# Patient Record
Sex: Female | Born: 1960 | Race: White | Hispanic: No | Marital: Single | Smoking: Current every day smoker
Health system: Southern US, Community
[De-identification: ages and names within clinical notes are randomized; demographics above are authoritative.]

## PROBLEM LIST (undated history)

## (undated) DIAGNOSIS — F419 Anxiety disorder, unspecified: Secondary | ICD-10-CM

## (undated) DIAGNOSIS — I10 Essential (primary) hypertension: Secondary | ICD-10-CM

## (undated) DIAGNOSIS — R519 Headache, unspecified: Secondary | ICD-10-CM

## (undated) DIAGNOSIS — R51 Headache: Secondary | ICD-10-CM

## (undated) HISTORY — PX: JOINT REPLACEMENT: SHX530

## (undated) HISTORY — PX: CHOLECYSTECTOMY: SHX55

## (undated) HISTORY — PX: FOOT SURGERY: SHX648

## (undated) HISTORY — PX: APPENDECTOMY: SHX54

## (undated) HISTORY — PX: PATELLA FRACTURE SURGERY: SHX735

## (undated) HISTORY — PX: ANKLE SURGERY: SHX546

---

## 1998-02-24 ENCOUNTER — Other Ambulatory Visit: Admission: RE | Admit: 1998-02-24 | Discharge: 1998-02-24 | Payer: Self-pay | Admitting: Family Medicine

## 1999-05-07 ENCOUNTER — Other Ambulatory Visit: Admission: RE | Admit: 1999-05-07 | Discharge: 1999-05-07 | Payer: Self-pay | Admitting: Obstetrics and Gynecology

## 2000-10-11 ENCOUNTER — Other Ambulatory Visit: Admission: RE | Admit: 2000-10-11 | Discharge: 2000-10-11 | Payer: Self-pay | Admitting: Obstetrics and Gynecology

## 2002-02-14 ENCOUNTER — Other Ambulatory Visit: Admission: RE | Admit: 2002-02-14 | Discharge: 2002-02-14 | Payer: Self-pay | Admitting: Obstetrics and Gynecology

## 2002-09-09 ENCOUNTER — Emergency Department (HOSPITAL_COMMUNITY): Admission: EM | Admit: 2002-09-09 | Discharge: 2002-09-09 | Payer: Self-pay | Admitting: *Deleted

## 2002-09-09 ENCOUNTER — Encounter: Payer: Self-pay | Admitting: *Deleted

## 2002-12-18 ENCOUNTER — Encounter: Payer: Self-pay | Admitting: Orthopedic Surgery

## 2002-12-18 ENCOUNTER — Encounter: Admission: RE | Admit: 2002-12-18 | Discharge: 2002-12-18 | Payer: Self-pay | Admitting: Orthopedic Surgery

## 2003-11-04 ENCOUNTER — Encounter: Admission: RE | Admit: 2003-11-04 | Discharge: 2003-11-04 | Payer: Self-pay | Admitting: Orthopedic Surgery

## 2004-07-10 IMAGING — CT CT EXTREM LOW W/O CM*R*
2 of 4 series · 6 of 14 positions shown, 7 images · non-contrast
Comparison: none

FINDINGS
CLINICAL DATA: RIGHT PATELLAR FRACTURE WITH CONTINUED PAIN AND SWELLING.
TECHNIQUE
MULTIDETECTOR HELICAL CT SCANNING OBTAINED THROUGH THE RIGHT KNEE.  SAGITTAL AND CORONAL
RECONSTRUCTED IMAGES WERE PERFORMED.
CT RIGHT LOWER EXTREMITY W/O CONTRAST
COMPARISON TO PLAIN FILMS PERFORMED ON 12/13/02 AND 09/09/02.
TWO SCREW AND WIRE FIXATION THROUGH THE COMMINUTED PATELLAR FRACTURE IS AGAIN NOTED CORRESPONDING
TO RECENT PLAIN FILMS.  FRACTURE LINES ARE STILL PRESENT WITH MINIMAL EVIDENCE OF BONY
BRIDGING/HEALING.   THERE IS MILD POSTERIOR DISPLACEMENT OF THE INFERIOR FRAGMENT OF APPROXIMATELY
5MM.  THERE IS A 3 X 5 X 10MM BONY FRAGMENT AT THE INFERIOR TIP OF THE PATELLA IN THE REGION OF
ORIGIN OF THE PATELLAR LIGAMENT.  EDEMA SURROUNDING THE VISUALIZED QUADRICEPS TENDON AND PATELLAR
LIGAMENT ARE NOTED BUT THOSE STRUCTURES ARE DIFFICULT TO EVALUATE ON CT.  SMALL KNEE EFFUSION IS
PRESENT.  THE DISTAL FEMUR AND PROXIMAL TIBIA AND FIBULA ARE UNREMARKABLE EXCEPT FOR MILD
OSTEOPENIA.
IMPRESSION
1.  COMMINUTED RIGHT PATELLAR FRACTURE WITH FRACTURE LINES STILL PRESENT WITH LITTLE TO NO EVIDENCE
OF INTERVAL BONY BRIDGING/HEALING.  INTERNAL SURGICAL FIXATION AS DESCRIBED.
2.  EDEMA SURROUNDING THE VISUALIZED QUADRICEPS TENDON AND PATELLAR LIGAMENT.  IT IS DIFFICULT TO
EVALUATE THESE STRUCTURES ON CT.
3.  SMALL KNEE EFFUSION.
CT MULTIPLANAR RECONSTRUCTION
MULTIPLANAR REFORMATTED CT IMAGES WERE RECONSTRUCTED FROM THE AXIAL CT DATA SET.  THESE IMAGES WERE
REVIEWED, AND PERTINENT FINDINGS ARE INCLUDED IN THE ACCOMPANYING COMPLETE CT REPORT.
SEE COMPLETE CT REPORT.

[Series 3: check recon 2 · axial · 0.31mm/px · z∈[-156,-96]mm · 3 of 98 slices shown, 4 images]
[im 25/98  soft-tissue]
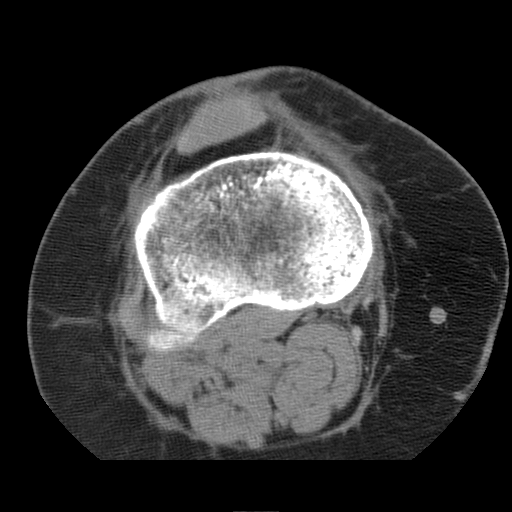
[im 25/98  bone]
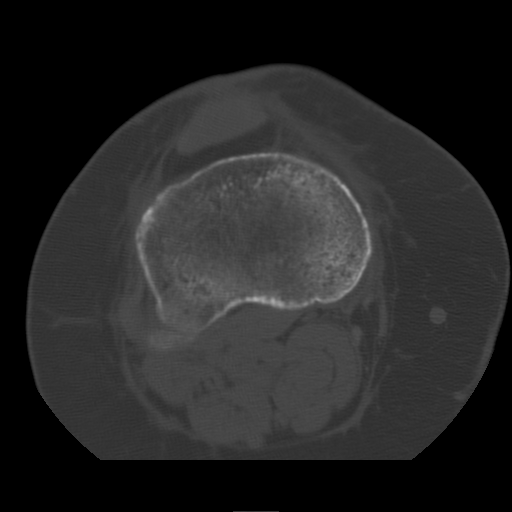
[im 49/98  bone]
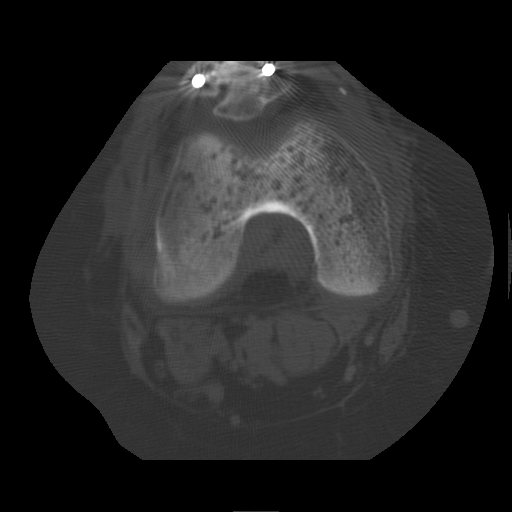
[im 73/98  bone]
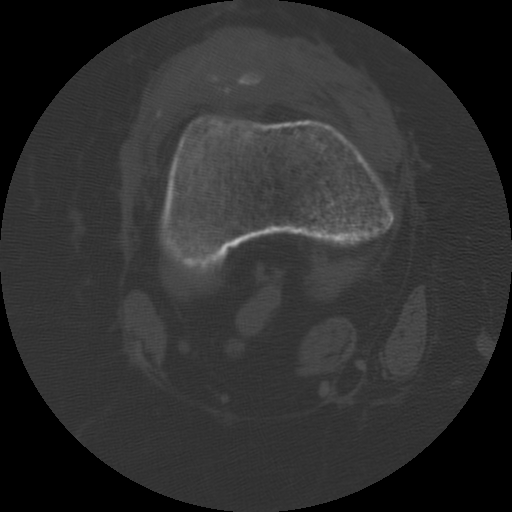

[Series 4: recon 2: check recon 2 · axial · 0.31mm/px · z∈[-155,-95]mm · 3 of 97 slices shown]
[im 25/97  bone]
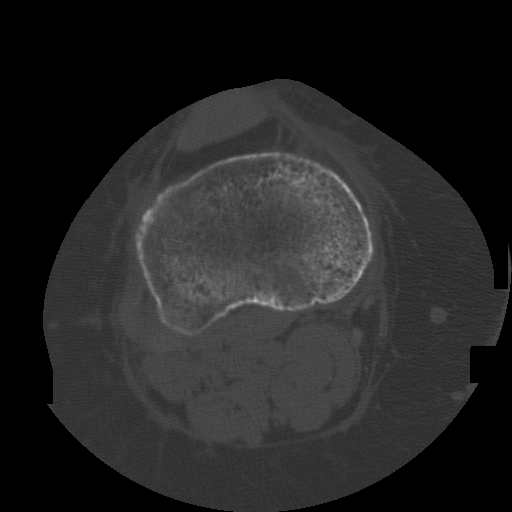
[im 49/97  bone]
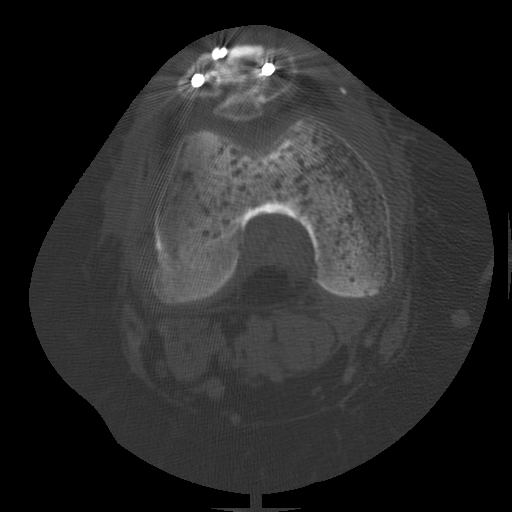
[im 73/97  bone]
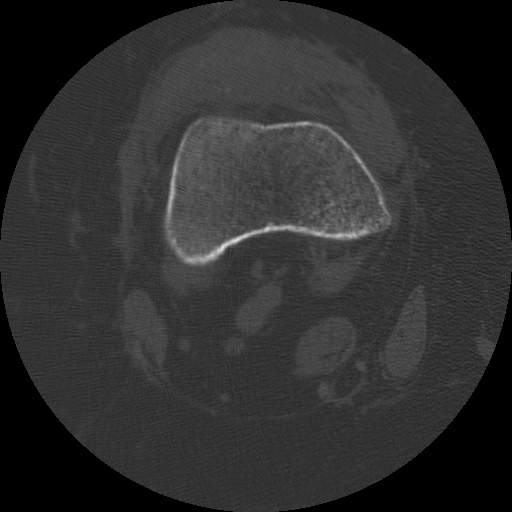

[6 of 14 positions shown; findings below may reference images not displayed]

## 2010-12-10 ENCOUNTER — Other Ambulatory Visit (HOSPITAL_COMMUNITY): Payer: Self-pay

## 2010-12-15 ENCOUNTER — Encounter (HOSPITAL_COMMUNITY)
Admission: RE | Admit: 2010-12-15 | Discharge: 2010-12-15 | Disposition: A | Discharge status: Home / Self Care | Payer: BC Managed Care – PPO | Source: Ambulatory Visit | Attending: Orthopedic Surgery | Admitting: Orthopedic Surgery

## 2010-12-15 LAB — CBC
HCT: 40.5 % (ref 36.0–46.0)
Hemoglobin: 14.2 g/dL (ref 12.0–15.0)
MCH: 31.6 pg (ref 26.0–34.0)
MCHC: 35.1 g/dL (ref 30.0–36.0)
MCV: 90.2 fL (ref 78.0–100.0)
Platelets: 462 10*3/uL — ABNORMAL HIGH (ref 150–400)
RBC: 4.49 MIL/uL (ref 3.87–5.11)
RDW: 11.8 % (ref 11.5–15.5)
WBC: 10.8 10*3/uL — ABNORMAL HIGH (ref 4.0–10.5)

## 2010-12-15 LAB — COMPREHENSIVE METABOLIC PANEL
Albumin: 4.2 g/dL (ref 3.5–5.2)
Alkaline Phosphatase: 93 U/L (ref 39–117)
BUN: 20 mg/dL (ref 6–23)
Calcium: 10.8 mg/dL — ABNORMAL HIGH (ref 8.4–10.5)
Potassium: 4.4 mEq/L (ref 3.5–5.1)
Total Protein: 7.4 g/dL (ref 6.0–8.3)

## 2010-12-15 LAB — URINALYSIS, ROUTINE W REFLEX MICROSCOPIC
Bilirubin Urine: NEGATIVE
Nitrite: NEGATIVE
Specific Gravity, Urine: 1.018 (ref 1.005–1.030)
pH: 5.5 (ref 5.0–8.0)

## 2010-12-15 LAB — PROTIME-INR: INR: 0.99 (ref 0.00–1.49)

## 2010-12-15 LAB — TYPE AND SCREEN: Antibody Screen: NEGATIVE

## 2010-12-15 LAB — URINE MICROSCOPIC-ADD ON

## 2010-12-15 LAB — SURGICAL PCR SCREEN: MRSA, PCR: NEGATIVE

## 2010-12-22 ENCOUNTER — Inpatient Hospital Stay (HOSPITAL_COMMUNITY): Payer: BC Managed Care – PPO

## 2010-12-22 ENCOUNTER — Inpatient Hospital Stay (HOSPITAL_COMMUNITY)
Admission: RE | Admit: 2010-12-22 | Discharge: 2010-12-24 | DRG: 209 | Disposition: A | Discharge status: Home Health | Payer: BC Managed Care – PPO | Source: Ambulatory Visit | Attending: Orthopedic Surgery | Admitting: Orthopedic Surgery

## 2010-12-22 DIAGNOSIS — M1710 Unilateral primary osteoarthritis, unspecified knee: Principal | ICD-10-CM | POA: Diagnosis present

## 2010-12-22 DIAGNOSIS — IMO0002 Reserved for concepts with insufficient information to code with codable children: Principal | ICD-10-CM | POA: Diagnosis present

## 2010-12-22 DIAGNOSIS — M8708 Idiopathic aseptic necrosis of bone, other site: Secondary | ICD-10-CM | POA: Diagnosis present

## 2010-12-23 LAB — PROTIME-INR: Prothrombin Time: 14.3 seconds (ref 11.6–15.2)

## 2010-12-23 LAB — BASIC METABOLIC PANEL
BUN: 13 mg/dL (ref 6–23)
Creatinine, Ser: 0.82 mg/dL (ref 0.4–1.2)
GFR calc Af Amer: >60 mL/min (ref >60)
GFR calc non Af Amer: >60 mL/min (ref >60)
Glucose, Bld: 118 mg/dL — ABNORMAL HIGH (ref 70–99)

## 2010-12-23 LAB — CBC
MCH: 31.4 pg (ref 26.0–34.0)
MCHC: 34 g/dL (ref 30.0–36.0)
Platelets: 354 10*3/uL (ref 150–400)
RDW: 12.2 % (ref 11.5–15.5)

## 2010-12-24 LAB — BASIC METABOLIC PANEL
BUN: 10 mg/dL (ref 6–23)
CO2: 25 mEq/L (ref 19–32)
Calcium: 8.9 mg/dL (ref 8.4–10.5)
Creatinine, Ser: 0.84 mg/dL (ref 0.50–1.10)

## 2010-12-24 LAB — PROTIME-INR
INR: 1.3 (ref 0.00–1.49)
Prothrombin Time: 16.4 seconds — ABNORMAL HIGH (ref 11.6–15.2)

## 2010-12-24 LAB — CBC
HCT: 28.8 % — ABNORMAL LOW (ref 36.0–46.0)
MCH: 31.8 pg (ref 26.0–34.0)
MCHC: 34.7 g/dL (ref 30.0–36.0)
RDW: 12.2 % (ref 11.5–15.5)

## 2010-12-30 NOTE — Op Note (Signed)
[name], [name]                  ACCOUNT NO.:  [id number]  MEDICAL RECORD NO.:  [id number]  LOCATION:  5007                         FACILITY:  [facility]  PHYSICIAN:  Loreta Ave, M.D. 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  PROCEDURE:  Modified minimally invasive right total knee replacement. Soft tissue balancing.  Stryker triathlon prosthesis.  Cemented pegged posterior stabilized #4 femoral component.  Cemented #4 tibial component 9-mm polyethylene insert.  Cemented resurfacing 35-mm patellar component.  Debridement and drilling into areas of residual avascular necrosis prior to implantation of implants.  SURGEON:  Loreta Ave, MD.  ASSISTANT:  Genene Churn. Barry Dienes, PA present throughout the entire case and necessary for timely completion of procedure.  ANESTHESIA:  General.  BLOOD LOSS:  Minimal.  SPECIMEN:  None.  COMPLICATIONS:  None.  DRESSINGS:  Soft compressive and knee immobilizer.  TOURNIQUET TIME:  1 hour 10 minutes.  DRAIN:  Hemovac x1.  PROCEDURE:  The patient was brought to the operating room, placed on the operating table in supine position.  Because of her size, positioning, draping, exposure, all made more difficult.  Knee examined.  Mild flexion contracture, varus alignment not extreme.  Tourniquet applied. Prepped and draped in usual sterile fashion.  Exsanguinated with elevation of Esmarch, tourniquet inflated to 350 mmHg.  Straight incision above the patella down the tibial tubercle.  Medial arthrotomy vastus splitting preserving  quad tendon.  Knee exposed.  Grade 4 changes throughout.  Softening of the subchondral bone, especially medial compartment, tibia, and femur.  Remnants of menisci, cruciate ligaments, periarticular spurs, loose bodies removed.  Distal femur exposed. Intramedullary guide placed.  A 10-mm resection set at 5 degrees of valgus.  Using epicondylar axis, size cut and fitted for posterior stabilized with #4 component.  Some residual areas of bone infarct on the medial side but still very good bone stock and I did not need at the stem.  I did drill into the area of mild residual change on the medial side.  Attention was turned to the tibia.  Extramedullary guide.  A 3- degree posterior slope cut below the defect.  Tibia exposed.  Size #4 component.  Residual avascular changes, medial tibial plateau, treated with multiple drilling.  Again, very reasonable bone stock, so I did not extend them either.  Patella exposed.  Posterior 10 mm removed. Drilled, sized, and fitted for a 35-mm component.  Trial put in place throughout.  A 9-mm insert.  With this construct, I then nicely balanced the knee in flexion/extension.  Good mechanical axis.  Good patellofemoral tracking.  Tibia was marked for rotation and hand reamed. All trials removed.  Copious irrigation with a pulse irrigating device. Cement prepared, placed on all components, firmly seated.  Polyethylene attached to tibia.  Knee reduced.  Patella held with a clamp.  Once the cement was hardened, the knee was reexamined.  Again, pleased with balancing, alignment, stability, tracking.  Hemovac placed through a separate stab wound.  Wound irrigated again.  Arthrotomy closed #1 Vicryl.  Skin and subcutaneous tissue with Vicryl and staples.  Sterile compressive dressing applied.  Tourniquet inflated and removed.  Knee immobilizer applied.  Anesthesia reversed. Brought to recovery room. Tolerated surgery well. No complications.     Loreta Ave,  M.D.     DFM/MEDQ  D:  12/23/2010  T:  12/24/2010  Job:  [id number]  Electronically Signed by Mckinley Jewel M.D. on 12/30/2010 04:11:08 PM

## 2011-06-24 ENCOUNTER — Other Ambulatory Visit: Payer: Self-pay | Admitting: Cardiology

## 2011-06-24 ENCOUNTER — Encounter (INDEPENDENT_AMBULATORY_CARE_PROVIDER_SITE_OTHER): Payer: BC Managed Care – PPO | Admitting: *Deleted

## 2011-06-24 DIAGNOSIS — M7989 Other specified soft tissue disorders: Secondary | ICD-10-CM

## 2011-06-24 DIAGNOSIS — R229 Localized swelling, mass and lump, unspecified: Secondary | ICD-10-CM

## 2015-09-18 ENCOUNTER — Other Ambulatory Visit: Payer: Self-pay | Admitting: Physician Assistant

## 2015-09-18 NOTE — H&P (Signed)
Kelsey [name] comes in for follow up.  Reviewed MRI, left ankle.  Again, all of her symptoms are in the ankle.  Previous injection was very helpful with Marcaine in place, but did not last beyond that.  MRI showed moderate degenerative changes ankle, subtalar joint and midfoot.  Large bone infarct in the distal tibia, smaller infarcts in the talus and navicular.  Tendons and ligaments intact.  There is some chronic tearing of the anterior talofibular ligament, but she has no instability.  Tibiotalar effusion, lesser extent subtalar.  Despite the bone infarcts and the changes in other joints, it is the ankle degenerative change that is the big problem for her.  Her x-rays I have that I have reviewed from the past still look reasonable.  There is some spurring and anterior bony impingement in her ankle, but the midfoot and subtalar joint do not look bad.   Of note, we went through this same process on her right ankle where there are more profound degenerative changes.  That had been treated with an intraarticular injection and then subsequently she went on a course of steroids for bronchitis.  That was over the course of almost a month.  Once she completed that course of oral steroids her right ankle symptoms became tolerable and although still present not anything enough to warrant any treatment at this time.   Remaining history and general exam is reviewed.   EXAMINATION: Lungs clear to auscultation bilaterally.  Heart sounds normal.  Specifically, left ankle she has limited dorsiflexion of less than 10 degrees.  Pain with the extreme of dorsiflexion.  She does not feel unstable.  Inversion, eversion and palpation of the subtalar joint really don't cause any pain.  No midfoot tenderness.  Neurovascularly intact distally.    DISPOSITION:  Symptoms in her left ankle are persisting.  The next step would be arthroscopic debridement and anterior decompression.  Based on the history of her other ankle, I have agreed to try  a course of oral Prednisone over the next two weeks with a 10 mg Sterapred dose pack.  I have cautioned her about side effects.  She is not diabetic.  Once she goes through that we will made a decision about her ankle.  I have discussed exam under anesthesia, arthroscopy, extensive debridement and bony decompression.  Paperwork to proceed with that is all complete, but again I am holding off making a final decision until we see what happens.  Norco she is taking is not helping enough and I did switch her to Percocet that I have told her to use very cautiously while we wait to see what the oral steroids do.  Long-term narcotic management of her ankle is not anything we are considering.  She understands.  I will wait to hear from her.    Loreta Aveaniel F. Murphy, M.D.

## 2015-09-21 ENCOUNTER — Encounter (HOSPITAL_BASED_OUTPATIENT_CLINIC_OR_DEPARTMENT_OTHER): Payer: Self-pay | Admitting: *Deleted

## 2015-09-22 ENCOUNTER — Encounter (HOSPITAL_BASED_OUTPATIENT_CLINIC_OR_DEPARTMENT_OTHER)
Admission: RE | Admit: 2015-09-22 | Discharge: 2015-09-22 | Disposition: A | Discharge status: Home / Self Care | Payer: BLUE CROSS/BLUE SHIELD | Source: Ambulatory Visit | Attending: Orthopedic Surgery | Admitting: Orthopedic Surgery

## 2015-09-22 DIAGNOSIS — F17200 Nicotine dependence, unspecified, uncomplicated: Secondary | ICD-10-CM | POA: Diagnosis not present

## 2015-09-22 DIAGNOSIS — M94272 Chondromalacia, left ankle and joints of left foot: Secondary | ICD-10-CM | POA: Diagnosis not present

## 2015-09-22 DIAGNOSIS — M19072 Primary osteoarthritis, left ankle and foot: Secondary | ICD-10-CM | POA: Diagnosis not present

## 2015-09-22 DIAGNOSIS — I10 Essential (primary) hypertension: Secondary | ICD-10-CM | POA: Diagnosis not present

## 2015-09-22 DIAGNOSIS — Z6841 Body Mass Index (BMI) 40.0 and over, adult: Secondary | ICD-10-CM | POA: Diagnosis not present

## 2015-09-22 DIAGNOSIS — Z791 Long term (current) use of non-steroidal anti-inflammatories (NSAID): Secondary | ICD-10-CM | POA: Diagnosis not present

## 2015-09-22 DIAGNOSIS — M24072 Loose body in left ankle: Secondary | ICD-10-CM | POA: Diagnosis not present

## 2015-09-22 DIAGNOSIS — M24172 Other articular cartilage disorders, left ankle: Secondary | ICD-10-CM | POA: Diagnosis not present

## 2015-09-22 DIAGNOSIS — Z79899 Other long term (current) drug therapy: Secondary | ICD-10-CM | POA: Diagnosis not present

## 2015-09-22 LAB — BASIC METABOLIC PANEL
ANION GAP: 14 (ref 5–15)
BUN: 21 mg/dL — AB (ref 6–20)
CALCIUM: 9.4 mg/dL (ref 8.9–10.3)
CO2: 23 mmol/L (ref 22–32)
CREATININE: 0.98 mg/dL (ref 0.44–1.00)
Chloride: 102 mmol/L (ref 101–111)
GFR calc Af Amer: >60 mL/min (ref >60)
GLUCOSE: 126 mg/dL — AB (ref 65–99)
POTASSIUM: 3.9 mmol/L (ref 3.5–5.1)
Sodium: 139 mmol/L (ref 135–145)

## 2015-09-23 NOTE — Anesthesia Preprocedure Evaluation (Addendum)
Anesthesia Evaluation  Patient identified by MRN, date of birth, ID band Patient awake    Reviewed: Allergy & Precautions, H&P , Patient's Chart, lab work & pertinent test results, reviewed documented beta blocker date and time   Airway Mallampati: II  TM Distance: >3 FB Neck ROM: full    Dental no notable dental hx.    Pulmonary Current Smoker,    Pulmonary exam normal breath sounds clear to auscultation       Cardiovascular hypertension, On Medications  Rhythm:regular Rate:Normal     Neuro/Psych    GI/Hepatic   Endo/Other  Morbid obesity  Renal/GU      Musculoskeletal   Abdominal   Peds  Hematology   Anesthesia Other Findings   Reproductive/Obstetrics                            Anesthesia Physical Anesthesia Plan  ASA: III  Anesthesia Plan:    Post-op Pain Management:    Induction: Intravenous  Airway Management Planned: LMA  Additional Equipment:   Intra-op Plan:   Post-operative Plan:   Informed Consent: I have reviewed the patients History and Physical, chart, labs and discussed the procedure including the risks, benefits and alternatives for the proposed anesthesia with the patient or authorized representative who has indicated his/her understanding and acceptance.   Dental Advisory Given and Dental advisory given  Plan Discussed with: CRNA and Surgeon  Anesthesia Plan Comments: (Discussed GA with LMA, possible sore throat, potential need to switch to ETT, N/V, pulmonary aspiration. Questions answered. )        Anesthesia Quick Evaluation

## 2015-09-24 ENCOUNTER — Ambulatory Visit (HOSPITAL_BASED_OUTPATIENT_CLINIC_OR_DEPARTMENT_OTHER): Payer: BLUE CROSS/BLUE SHIELD | Admitting: Anesthesiology

## 2015-09-24 ENCOUNTER — Encounter (HOSPITAL_BASED_OUTPATIENT_CLINIC_OR_DEPARTMENT_OTHER)
Admission: RE | Disposition: A | Discharge status: Home / Self Care | Payer: Self-pay | Source: Ambulatory Visit | Attending: Orthopedic Surgery

## 2015-09-24 ENCOUNTER — Encounter (HOSPITAL_BASED_OUTPATIENT_CLINIC_OR_DEPARTMENT_OTHER): Payer: Self-pay | Admitting: *Deleted

## 2015-09-24 ENCOUNTER — Ambulatory Visit (HOSPITAL_BASED_OUTPATIENT_CLINIC_OR_DEPARTMENT_OTHER)
Admission: RE | Admit: 2015-09-24 | Discharge: 2015-09-24 | Disposition: A | Discharge status: Home / Self Care | Payer: BLUE CROSS/BLUE SHIELD | Source: Ambulatory Visit | Attending: Orthopedic Surgery | Admitting: Orthopedic Surgery

## 2015-09-24 DIAGNOSIS — Z79899 Other long term (current) drug therapy: Secondary | ICD-10-CM | POA: Insufficient documentation

## 2015-09-24 DIAGNOSIS — Z791 Long term (current) use of non-steroidal anti-inflammatories (NSAID): Secondary | ICD-10-CM | POA: Insufficient documentation

## 2015-09-24 DIAGNOSIS — M94272 Chondromalacia, left ankle and joints of left foot: Secondary | ICD-10-CM | POA: Insufficient documentation

## 2015-09-24 DIAGNOSIS — I10 Essential (primary) hypertension: Secondary | ICD-10-CM | POA: Insufficient documentation

## 2015-09-24 DIAGNOSIS — M19072 Primary osteoarthritis, left ankle and foot: Secondary | ICD-10-CM | POA: Diagnosis not present

## 2015-09-24 DIAGNOSIS — F17200 Nicotine dependence, unspecified, uncomplicated: Secondary | ICD-10-CM | POA: Insufficient documentation

## 2015-09-24 DIAGNOSIS — M24172 Other articular cartilage disorders, left ankle: Secondary | ICD-10-CM | POA: Insufficient documentation

## 2015-09-24 DIAGNOSIS — Z6841 Body Mass Index (BMI) 40.0 and over, adult: Secondary | ICD-10-CM | POA: Insufficient documentation

## 2015-09-24 DIAGNOSIS — M24072 Loose body in left ankle: Secondary | ICD-10-CM | POA: Insufficient documentation

## 2015-09-24 HISTORY — DX: Headache, unspecified: R51.9

## 2015-09-24 HISTORY — DX: Anxiety disorder, unspecified: F41.9

## 2015-09-24 HISTORY — PX: CHONDROPLASTY: SHX5177

## 2015-09-24 HISTORY — DX: Headache: R51

## 2015-09-24 HISTORY — PX: ANKLE ARTHROSCOPY: SHX545

## 2015-09-24 HISTORY — DX: Essential (primary) hypertension: I10

## 2015-09-24 SURGERY — ARTHROSCOPY, ANKLE
Anesthesia: General | Site: Ankle | Laterality: Left

## 2015-09-24 MED ORDER — GLYCOPYRROLATE 0.2 MG/ML IJ SOLN: 0.2000 mg | Freq: Once | INTRAMUSCULAR | Status: DC | PRN

## 2015-09-24 MED ORDER — FENTANYL CITRATE (PF) 100 MCG/2ML IJ SOLN
INTRAMUSCULAR | Status: AC
  Filled 2015-09-24: qty 2

## 2015-09-24 MED ORDER — PROPOFOL 500 MG/50ML IV EMUL
INTRAVENOUS | Status: AC
  Filled 2015-09-24: qty 50

## 2015-09-24 MED ORDER — CLINDAMYCIN PHOSPHATE 900 MG/50ML IV SOLN
900.0000 mg | INTRAVENOUS | Status: AC
  Administered 2015-09-24: 900 mg via INTRAVENOUS

## 2015-09-24 MED ORDER — SCOPOLAMINE 1 MG/3DAYS TD PT72: 1.0000 | MEDICATED_PATCH | Freq: Once | TRANSDERMAL | Status: DC | PRN

## 2015-09-24 MED ORDER — PHENYLEPHRINE HCL 10 MG/ML IJ SOLN
INTRAMUSCULAR | Status: DC | PRN
  Administered 2015-09-24: 40 ug via INTRAVENOUS
  Administered 2015-09-24 (×2): 80 ug via INTRAVENOUS
  Administered 2015-09-24: 40 ug via INTRAVENOUS
  Administered 2015-09-24: 80 ug via INTRAVENOUS

## 2015-09-24 MED ORDER — MIDAZOLAM HCL 2 MG/2ML IJ SOLN
INTRAMUSCULAR | Status: AC
  Filled 2015-09-24: qty 2

## 2015-09-24 MED ORDER — LIDOCAINE HCL (CARDIAC) 20 MG/ML IV SOLN
INTRAVENOUS | Status: AC
  Filled 2015-09-24: qty 5

## 2015-09-24 MED ORDER — ONDANSETRON HCL 4 MG/2ML IJ SOLN
INTRAMUSCULAR | Status: DC | PRN
  Administered 2015-09-24: 4 mg via INTRAVENOUS

## 2015-09-24 MED ORDER — LACTATED RINGERS IV SOLN
INTRAVENOUS | Status: DC
  Administered 2015-09-24 (×2): via INTRAVENOUS

## 2015-09-24 MED ORDER — BUPIVACAINE HCL (PF) 0.5 % IJ SOLN
INTRAMUSCULAR | Status: DC | PRN
  Administered 2015-09-24: 3 mL

## 2015-09-24 MED ORDER — ONDANSETRON HCL 4 MG/2ML IJ SOLN
INTRAMUSCULAR | Status: AC
  Filled 2015-09-24: qty 2

## 2015-09-24 MED ORDER — LIDOCAINE HCL (CARDIAC) 20 MG/ML IV SOLN
INTRAVENOUS | Status: DC | PRN
Start: 2015-09-24 — End: 2015-09-24
  Administered 2015-09-24: 50 mg via INTRAVENOUS

## 2015-09-24 MED ORDER — MIDAZOLAM HCL 2 MG/2ML IJ SOLN
1.0000 mg | INTRAMUSCULAR | Status: DC | PRN
  Administered 2015-09-24: 2 mg via INTRAVENOUS

## 2015-09-24 MED ORDER — SUCCINYLCHOLINE CHLORIDE 20 MG/ML IJ SOLN
INTRAMUSCULAR | Status: AC
  Filled 2015-09-24: qty 1

## 2015-09-24 MED ORDER — FENTANYL CITRATE (PF) 100 MCG/2ML IJ SOLN
25.0000 ug | INTRAMUSCULAR | Status: DC | PRN
  Administered 2015-09-24 (×3): 50 ug via INTRAVENOUS

## 2015-09-24 MED ORDER — FENTANYL CITRATE (PF) 100 MCG/2ML IJ SOLN
50.0000 ug | INTRAMUSCULAR | Status: DC | PRN
  Administered 2015-09-24: 100 ug via INTRAVENOUS

## 2015-09-24 MED ORDER — PHENYLEPHRINE 40 MCG/ML (10ML) SYRINGE FOR IV PUSH (FOR BLOOD PRESSURE SUPPORT)
PREFILLED_SYRINGE | INTRAVENOUS | Status: AC
  Filled 2015-09-24: qty 10

## 2015-09-24 MED ORDER — CLINDAMYCIN PHOSPHATE 900 MG/50ML IV SOLN
INTRAVENOUS | Status: AC
Start: 2015-09-24 — End: 2015-09-24
  Filled 2015-09-24: qty 50

## 2015-09-24 MED ORDER — EPHEDRINE SULFATE 50 MG/ML IJ SOLN
INTRAMUSCULAR | Status: DC | PRN
  Administered 2015-09-24: 10 mg via INTRAVENOUS

## 2015-09-24 MED ORDER — OXYCODONE HCL 5 MG PO TABS
ORAL_TABLET | ORAL | Status: AC
  Filled 2015-09-24: qty 1

## 2015-09-24 MED ORDER — CHLORHEXIDINE GLUCONATE 4 % EX LIQD: 60.0000 mL | Freq: Once | CUTANEOUS | Status: DC

## 2015-09-24 MED ORDER — BUPIVACAINE HCL (PF) 0.5 % IJ SOLN
INTRAMUSCULAR | Status: AC
  Filled 2015-09-24: qty 90

## 2015-09-24 MED ORDER — DEXAMETHASONE SODIUM PHOSPHATE 10 MG/ML IJ SOLN
INTRAMUSCULAR | Status: AC
Start: 2015-09-24 — End: 2015-09-24
  Filled 2015-09-24: qty 1

## 2015-09-24 MED ORDER — PROPOFOL 10 MG/ML IV BOLUS
INTRAVENOUS | Status: DC | PRN
  Administered 2015-09-24: 50 mg via INTRAVENOUS
  Administered 2015-09-24: 200 mg via INTRAVENOUS
  Administered 2015-09-24: 250 mg via INTRAVENOUS

## 2015-09-24 MED ORDER — LACTATED RINGERS IV SOLN: INTRAVENOUS | Status: DC

## 2015-09-24 MED ORDER — DEXAMETHASONE SODIUM PHOSPHATE 10 MG/ML IJ SOLN
INTRAMUSCULAR | Status: DC | PRN
  Administered 2015-09-24: 10 mg via INTRAVENOUS

## 2015-09-24 MED ORDER — OXYCODONE HCL 5 MG PO TABS
5.0000 mg | ORAL_TABLET | Freq: Once | ORAL | Status: AC
  Administered 2015-09-24: 5 mg via ORAL

## 2015-09-24 MED ORDER — BUPIVACAINE HCL (PF) 0.25 % IJ SOLN
INTRAMUSCULAR | Status: AC
  Filled 2015-09-24: qty 90

## 2015-09-24 SURGICAL SUPPLY — 49 items
BANDAGE ACE 4X5 VEL STRL LF (GAUZE/BANDAGES/DRESSINGS) ×2 IMPLANT
BLADE 4.2CUDA (BLADE) IMPLANT
BLADE CUDA GRT WHITE 3.5 (BLADE) IMPLANT
BLADE CUDA SHAVER 3.5 (BLADE) IMPLANT
BLADE CUTTER GATOR 3.5 (BLADE) IMPLANT
BLADE GREAT WHITE 4.2 (BLADE) IMPLANT
BNDG ESMARK 4X9 LF (GAUZE/BANDAGES/DRESSINGS) ×2 IMPLANT
BUR CUDA 2.9 (BURR) IMPLANT
BUR FULL RADIUS 2.9 (BURR) ×2 IMPLANT
BUR OVAL 4.0 (BURR) IMPLANT
BUR OVAL 6.0 (BURR) IMPLANT
CUFF TOURNIQUET SINGLE 34IN LL (TOURNIQUET CUFF) IMPLANT
CUTTER MENISCUS  4.2MM (BLADE)
CUTTER MENISCUS 4.2MM (BLADE) IMPLANT
DECANTER SPIKE VIAL GLASS SM (MISCELLANEOUS) IMPLANT
DRAPE ARTHROSCOPY W/POUCH 90 (DRAPES) ×2 IMPLANT
DRAPE OEC MINIVIEW 54X84 (DRAPES) IMPLANT
DRAPE SURG 17X23 STRL (DRAPES) ×2 IMPLANT
DURAPREP 26ML APPLICATOR (WOUND CARE) ×2 IMPLANT
ELECT MENISCUS 165MM 90D (ELECTRODE) IMPLANT
ELECT REM PT RETURN 9FT ADLT (ELECTROSURGICAL) ×2
ELECTRODE REM PT RTRN 9FT ADLT (ELECTROSURGICAL) ×1 IMPLANT
GAUZE SPONGE 4X4 12PLY STRL (GAUZE/BANDAGES/DRESSINGS) ×4 IMPLANT
GAUZE XEROFORM 1X8 LF (GAUZE/BANDAGES/DRESSINGS) ×2 IMPLANT
GLOVE BIO SURGEON STRL SZ 6.5 (GLOVE) ×4 IMPLANT
GLOVE BIOGEL PI IND STRL 7.0 (GLOVE) ×4 IMPLANT
GLOVE BIOGEL PI INDICATOR 7.0 (GLOVE) ×4
GLOVE ECLIPSE 7.0 STRL STRAW (GLOVE) ×2 IMPLANT
GLOVE SURG ORTHO 8.0 STRL STRW (GLOVE) ×2 IMPLANT
GOWN STRL REUS W/ TWL LRG LVL3 (GOWN DISPOSABLE) ×2 IMPLANT
GOWN STRL REUS W/ TWL XL LVL3 (GOWN DISPOSABLE) ×1 IMPLANT
GOWN STRL REUS W/TWL LRG LVL3 (GOWN DISPOSABLE) ×2
GOWN STRL REUS W/TWL XL LVL3 (GOWN DISPOSABLE) ×1
IV NS IRRIG 3000ML ARTHROMATIC (IV SOLUTION) IMPLANT
MANIFOLD NEPTUNE II (INSTRUMENTS) IMPLANT
NEEDLE KEITH (NEEDLE) IMPLANT
PACK ARTHROSCOPY DSU (CUSTOM PROCEDURE TRAY) ×2 IMPLANT
PACK BASIN DAY SURGERY FS (CUSTOM PROCEDURE TRAY) ×2 IMPLANT
PENCIL BUTTON HOLSTER BLD 10FT (ELECTRODE) IMPLANT
SET ARTHROSCOPY TUBING (MISCELLANEOUS) ×1
SET ARTHROSCOPY TUBING LN (MISCELLANEOUS) ×1 IMPLANT
SLEEVE SCD COMPRESS KNEE MED (MISCELLANEOUS) IMPLANT
STOCKINETTE 4X48 STRL (DRAPES) ×2 IMPLANT
STRAP ANKLE FOOT DISTRACTOR (ORTHOPEDIC SUPPLIES) IMPLANT
SUT ETHILON 3 0 PS 1 (SUTURE) ×2 IMPLANT
SUT VIC AB 3-0 SH 27 (SUTURE)
SUT VIC AB 3-0 SH 27X BRD (SUTURE) IMPLANT
TUBE CONNECTING 20X1/4 (TUBING) IMPLANT
WATER STERILE IRR 1000ML POUR (IV SOLUTION) ×2 IMPLANT

## 2015-09-24 NOTE — Transfer of Care (Signed)
Immediate Anesthesia Transfer of Care Note  Patient: Kelsey [name]  Procedure(s) Performed: Procedure(s): LEFT ANKLE ARTHROSCOPY WITH DEBRIDEMENT removal of spurs loose bodies (Left) CHONDROPLASTY (Left)  Patient Location: PACU  Anesthesia Type:General and GA combined with regional for post-op pain  Level of Consciousness: awake and alert   Airway & Oxygen Therapy: Patient Spontanous Breathing and Patient connected to face mask oxygen  Post-op Assessment: Report given to RN and Post -op Vital signs reviewed and stable  Post vital signs: Reviewed and stable  Last Vitals:  Filed Vitals:   09/24/15 0730 09/24/15 0735  BP: 109/73   Pulse: 81 79  Temp:    Resp: 21 13    Complications: No apparent anesthesia complications

## 2015-09-24 NOTE — Interval H&P Note (Signed)
History and Physical Interval Note:  09/24/2015 8:48 AM  Kelsey [name]  has presented today for surgery, with the diagnosis of OTHER ARTICULAR CARTILAGE DISORDERS LEFT ANKLE  The various methods of treatment have been discussed with the patient and family. After consideration of risks, benefits and other options for treatment, the patient has consented to  Procedure(s): LEFT ANKLE ARTHROSCOPY WITH DEBRIDEMENT removal of spurs loose bodies (Left) CHONDROPLASTY (Left) as a surgical intervention .  The patient's history has been reviewed, patient examined, no change in status, stable for surgery.  I have reviewed the patient's chart and labs.  Questions were answered to the patient's satisfaction.     Loreta Aveaniel F Stella Encarnacion

## 2015-09-24 NOTE — Discharge Instructions (Signed)
Discharge Instructions after Ankle Arthroscopy   You will have a light dressing on your Ankle.  Leave the dressing in place until the third day after your surgery and then remove it and place a band-aid over the stitches.  After the bandage has been removed you may shower, but do not soak the incision. You may begin gentle motion of your leg immediately after surgery. Pump your foot up and down 20 times per hour, every hour you are awake.  Apply ice to the ankle 3 times per day for 30 minutes for the first 1 week until your knee is feeling comfortable again. Do not use heat.  Use your medicine as needed over the first 48 hours, and then you can begin to taper your use. You may take Extra Strength Tylenol or Tylenol only in place of the pain pills.    Please call [phone] for any problems. Including the following:  - excessive redness of the incisions - drainage for more than 4 days - fever of more than 101.5 F  *Please note that pain medications will not be refilled after hours or on weekends.   Post Anesthesia Home Care Instructions  Activity: Get plenty of rest for the remainder of the day. A responsible adult should stay with you for 24 hours following the procedure.  For the next 24 hours, DO NOT: -Drive a car -Advertising copywriter -Drink alcoholic beverages -Take any medication unless instructed by your physician -Make any legal decisions or sign important papers.  Meals: Start with liquid foods such as gelatin or soup. Progress to regular foods as tolerated. Avoid greasy, spicy, heavy foods. If nausea and/or vomiting occur, drink only clear liquids until the nausea and/or vomiting subsides. Call your physician if vomiting continues.  Special Instructions/Symptoms: Your throat may feel dry or sore from the anesthesia or the breathing tube placed in your throat during surgery. If this causes discomfort, gargle with warm salt water. The discomfort should disappear within 24  hours.  If you had a scopolamine patch placed behind your ear for the management of post- operative nausea and/or vomiting:  1. The medication in the patch is effective for 72 hours, after which it should be removed.  Wrap patch in a tissue and discard in the trash. Wash hands thoroughly with soap and water. 2. You may remove the patch earlier than 72 hours if you experience unpleasant side effects which may include dry mouth, dizziness or visual disturbances. 3. Avoid touching the patch. Wash your hands with soap and water after contact with the patch.     Regional Anesthesia Blocks  1. Numbness or the inability to move the "blocked" extremity may last from 3-48 hours after placement. The length of time depends on the medication injected and your individual response to the medication. If the numbness is not going away after 48 hours, call your surgeon.  2. The extremity that is blocked will need to be protected until the numbness is gone and the  Strength has returned. Because you cannot feel it, you will need to take extra care to avoid injury. Because it may be weak, you may have difficulty moving it or using it. You may not know what position it is in without looking at it while the block is in effect.  3. For blocks in the legs and feet, returning to weight bearing and walking needs to be done carefully. You will need to wait until the numbness is entirely gone and the strength has returned.  You should be able to move your leg and foot normally before you try and bear weight or walk. You will need someone to be with you when you first try to ensure you do not fall and possibly risk injury.  4. Bruising and tenderness at the needle site are common side effects and will resolve in a few days.  5. Persistent numbness or new problems with movement should be communicated to the surgeon or the [facility] [phone])/ [facility] [phone]).

## 2015-09-24 NOTE — Progress Notes (Signed)
Assisted Dr. Jean RosenthalJackson with left, ultrasound guided, popliteal block. Side rails up, monitors on throughout procedure. See vital signs in flow sheet. Tolerated Procedure well.

## 2015-09-24 NOTE — Anesthesia Postprocedure Evaluation (Signed)
Anesthesia Post Note  Patient: Kelsey [name]  Procedure(s) Performed: Procedure(s) (LRB): LEFT ANKLE ARTHROSCOPY WITH DEBRIDEMENT removal of spurs loose bodies (Left) CHONDROPLASTY (Left)  Patient location during evaluation: PACU Anesthesia Type: General Level of consciousness: awake and alert Pain management: pain level controlled Vital Signs Assessment: post-procedure vital signs reviewed and stable Respiratory status: spontaneous breathing, nonlabored ventilation and respiratory function stable Cardiovascular status: blood pressure returned to baseline and stable Postop Assessment: no signs of nausea or vomiting Anesthetic complications: no    Last Vitals:  Filed Vitals:   09/24/15 0930 09/24/15 1015  BP: 106/52 118/79  Pulse: 79 77  Temp:  36.5 C  Resp: 15 18    Last Pain:  Filed Vitals:   09/24/15 1030  PainSc: 5                  Atley Neubert A

## 2015-09-24 NOTE — Anesthesia Procedure Notes (Signed)
Procedure Name: LMA Insertion Date/Time: 09/24/2015 7:41 AM Performed by: Caren MacadamARTER, Jolly Carlini W Pre-anesthesia Checklist: Patient identified, Emergency Drugs available, Suction available and Patient being monitored Patient Re-evaluated:Patient Re-evaluated prior to inductionOxygen Delivery Method: Circle System Utilized Preoxygenation: Pre-oxygenation with 100% oxygen Intubation Type: IV induction Ventilation: Mask ventilation without difficulty LMA: LMA inserted LMA Size: 4.0 Number of attempts: 1 Airway Equipment and Method: Bite block Placement Confirmation: positive ETCO2 Tube secured with: Tape Dental Injury: Teeth and Oropharynx as per pre-operative assessment

## 2015-09-25 ENCOUNTER — Encounter (HOSPITAL_BASED_OUTPATIENT_CLINIC_OR_DEPARTMENT_OTHER): Payer: Self-pay | Admitting: Orthopedic Surgery

## 2015-09-25 NOTE — Op Note (Signed)
[name], Kelsey [name]                  ACCOUNT NO.:  [id number]  MEDICAL RECORD NO.:  [id number]  LOCATION:                                 FACILITY:  PHYSICIAN:  Loreta Aveaniel F. Dabney Dever, M.D. DATE OF BIRTH:  [birth year]  DATE OF PROCEDURE:  09/24/2015 DATE OF DISCHARGE:                              OPERATIVE REPORT   PREOPERATIVE DIAGNOSIS:  Progressive degenerative arthritis, chondromalacia, left ankle.  Anterior bony impingement.  POSTOPERATIVE DIAGNOSIS:  Progressive degenerative arthritis, chondromalacia, left ankle.  Anterior bony impingement with diffuse grade 2 changes and focal grade 3 anteromedially.  Also, a partially tether loose body anteriorly.  PROCEDURE:  Left ankle exam under anesthesia, arthroscopy. Chondroplasty.  Extensive debridement, removal of all of the anterior spurs from the tibia and talus.  Removal of loose body.  SURGEON:  Loreta Aveaniel F. Johnice Riebe, MD  ASSISTANT:  Mikey KirschnerLindsey Stanberry, PA  ANESTHESIA:  General.  BLOOD LOSS:  Minimal.  SPECIMENS:  None.  CULTURES:  None.  COMPLICATIONS:  None.  DRESSINGS:  Soft compressive.  TOURNIQUET TIME:  40 minutes.  PROCEDURE IN DETAIL:  The patient was brought to the operating room, placed on the operating table in supine position.  After adequate anesthesia had been obtained, tourniquet applied, stirrup applied, prepped and draped in usual sterile fashion.  Exsanguinated with elevation of Esmarch.  Tourniquet inflated to 350 mmHg.  Good stability. Limited dorsiflexion, otherwise good motion.  Fluoroscopic guidance. Two portals, anteromedial, anterolateral.  Arthroscope introduced, ankle distended and inspected.  Focal grade 3 changes tibia and talus medially.  Debrided.  Nothing grade 4.  Diffuse grade 2 throughout. Proximally tether loose body in the front of the tibia removed.  I then used a bur to remove all of the impinging spurs from the tibia and talus, this was more prominent in medial than lateral.  At  completion, nice debridement, full dorsiflexion.  Entire ankle examined.  No other findings appreciated. Instruments were removed.  Portals were closed with nylon.  Intra- articular injection with Depo-Medrol and Marcaine.  Sterile compressive dressing applied.  Tourniquet inflated and removed.  Anesthesia reversed, brought to recovery room.  Tolerated the surgery well.  No complications.     Loreta Aveaniel F. Tequila Rottmann, M.D.     DFM/MEDQ  D:  09/24/2015  T:  09/24/2015  Job:  [id number]

## 2015-10-16 ENCOUNTER — Encounter (HOSPITAL_BASED_OUTPATIENT_CLINIC_OR_DEPARTMENT_OTHER): Payer: Self-pay | Admitting: Orthopedic Surgery

## 2015-11-10 DIAGNOSIS — M24072 Loose body in left ankle: Secondary | ICD-10-CM | POA: Diagnosis not present

## 2015-12-03 DIAGNOSIS — F4321 Adjustment disorder with depressed mood: Secondary | ICD-10-CM | POA: Diagnosis not present

## 2015-12-03 DIAGNOSIS — G43009 Migraine without aura, not intractable, without status migrainosus: Secondary | ICD-10-CM | POA: Diagnosis not present

## 2015-12-03 DIAGNOSIS — F17200 Nicotine dependence, unspecified, uncomplicated: Secondary | ICD-10-CM | POA: Diagnosis not present

## 2015-12-03 DIAGNOSIS — I10 Essential (primary) hypertension: Secondary | ICD-10-CM | POA: Diagnosis not present

## 2016-05-30 DIAGNOSIS — Z23 Encounter for immunization: Secondary | ICD-10-CM | POA: Diagnosis not present

## 2016-05-30 DIAGNOSIS — Z0000 Encounter for general adult medical examination without abnormal findings: Secondary | ICD-10-CM | POA: Diagnosis not present

## 2016-05-30 DIAGNOSIS — Z1389 Encounter for screening for other disorder: Secondary | ICD-10-CM | POA: Diagnosis not present

## 2016-06-15 DIAGNOSIS — Z1231 Encounter for screening mammogram for malignant neoplasm of breast: Secondary | ICD-10-CM | POA: Diagnosis not present

## 2016-06-17 DIAGNOSIS — J209 Acute bronchitis, unspecified: Secondary | ICD-10-CM | POA: Diagnosis not present

## 2016-11-30 DIAGNOSIS — F4321 Adjustment disorder with depressed mood: Secondary | ICD-10-CM | POA: Diagnosis not present

## 2016-11-30 DIAGNOSIS — I10 Essential (primary) hypertension: Secondary | ICD-10-CM | POA: Diagnosis not present

## 2016-11-30 DIAGNOSIS — Z6841 Body Mass Index (BMI) 40.0 and over, adult: Secondary | ICD-10-CM | POA: Diagnosis not present

## 2017-05-26 DIAGNOSIS — F4321 Adjustment disorder with depressed mood: Secondary | ICD-10-CM | POA: Diagnosis not present

## 2017-05-26 DIAGNOSIS — G4700 Insomnia, unspecified: Secondary | ICD-10-CM | POA: Diagnosis not present

## 2017-05-26 DIAGNOSIS — Z6841 Body Mass Index (BMI) 40.0 and over, adult: Secondary | ICD-10-CM | POA: Diagnosis not present

## 2017-05-26 DIAGNOSIS — E7800 Pure hypercholesterolemia, unspecified: Secondary | ICD-10-CM | POA: Diagnosis not present

## 2017-05-29 DIAGNOSIS — J209 Acute bronchitis, unspecified: Secondary | ICD-10-CM | POA: Diagnosis not present

## 2017-06-07 DIAGNOSIS — R05 Cough: Secondary | ICD-10-CM | POA: Diagnosis not present

## 2017-06-07 DIAGNOSIS — R748 Abnormal levels of other serum enzymes: Secondary | ICD-10-CM | POA: Diagnosis not present

## 2017-06-07 DIAGNOSIS — R739 Hyperglycemia, unspecified: Secondary | ICD-10-CM | POA: Diagnosis not present

## 2017-06-07 DIAGNOSIS — Z20818 Contact with and (suspected) exposure to other bacterial communicable diseases: Secondary | ICD-10-CM | POA: Diagnosis not present

## 2018-01-18 DIAGNOSIS — Z1331 Encounter for screening for depression: Secondary | ICD-10-CM | POA: Diagnosis not present

## 2018-01-18 DIAGNOSIS — Z1339 Encounter for screening examination for other mental health and behavioral disorders: Secondary | ICD-10-CM | POA: Diagnosis not present

## 2018-01-18 DIAGNOSIS — Z6841 Body Mass Index (BMI) 40.0 and over, adult: Secondary | ICD-10-CM | POA: Diagnosis not present

## 2018-01-18 DIAGNOSIS — E785 Hyperlipidemia, unspecified: Secondary | ICD-10-CM | POA: Diagnosis not present

## 2018-01-18 DIAGNOSIS — R739 Hyperglycemia, unspecified: Secondary | ICD-10-CM | POA: Diagnosis not present

## 2018-06-13 DIAGNOSIS — Z23 Encounter for immunization: Secondary | ICD-10-CM | POA: Diagnosis not present

## 2018-08-03 DIAGNOSIS — J0100 Acute maxillary sinusitis, unspecified: Secondary | ICD-10-CM | POA: Diagnosis not present

## 2018-12-31 DIAGNOSIS — J329 Chronic sinusitis, unspecified: Secondary | ICD-10-CM | POA: Diagnosis not present

## 2019-02-14 DIAGNOSIS — Z6841 Body Mass Index (BMI) 40.0 and over, adult: Secondary | ICD-10-CM | POA: Diagnosis not present

## 2019-02-14 DIAGNOSIS — I10 Essential (primary) hypertension: Secondary | ICD-10-CM | POA: Diagnosis not present

## 2019-02-14 DIAGNOSIS — R739 Hyperglycemia, unspecified: Secondary | ICD-10-CM | POA: Diagnosis not present

## 2019-02-14 DIAGNOSIS — E785 Hyperlipidemia, unspecified: Secondary | ICD-10-CM | POA: Diagnosis not present

## 2019-08-15 DIAGNOSIS — R05 Cough: Secondary | ICD-10-CM | POA: Diagnosis not present

## 2019-08-15 DIAGNOSIS — R519 Headache, unspecified: Secondary | ICD-10-CM | POA: Diagnosis not present

## 2024-05-17 DIAGNOSIS — R0981 Nasal congestion: Secondary | ICD-10-CM | POA: Diagnosis not present

## 2024-05-17 DIAGNOSIS — R059 Cough, unspecified: Secondary | ICD-10-CM | POA: Diagnosis not present

## 2024-05-17 DIAGNOSIS — R070 Pain in throat: Secondary | ICD-10-CM | POA: Diagnosis not present

## 2024-06-04 DIAGNOSIS — Z6837 Body mass index (BMI) 37.0-37.9, adult: Secondary | ICD-10-CM | POA: Diagnosis not present

## 2024-06-04 DIAGNOSIS — E1165 Type 2 diabetes mellitus with hyperglycemia: Secondary | ICD-10-CM | POA: Diagnosis not present

## 2024-06-04 DIAGNOSIS — I10 Essential (primary) hypertension: Secondary | ICD-10-CM | POA: Diagnosis not present
